# Patient Record
Sex: Female | Born: 1935 | Race: Asian | Hispanic: No | Smoking: Never smoker
Health system: Southern US, Community
[De-identification: ages and names within clinical notes are randomized; demographics above are authoritative.]

## PROBLEM LIST (undated history)

## (undated) DIAGNOSIS — J45909 Unspecified asthma, uncomplicated: Secondary | ICD-10-CM

## (undated) HISTORY — DX: Unspecified asthma, uncomplicated: J45.909

---

## 2016-10-30 ENCOUNTER — Ambulatory Visit: Payer: Self-pay | Admitting: Nurse Practitioner

## 2016-10-30 VITALS — BP 143/92 | HR 85 | Temp 98.1°F | Wt 95.0 lb

## 2016-10-30 DIAGNOSIS — S62102A Fracture of unspecified carpal bone, left wrist, initial encounter for closed fracture: Secondary | ICD-10-CM

## 2016-10-30 NOTE — Progress Notes (Unsigned)
PT HERE TODAY WITH HER DAUGHTER, TAMIL IS PRIMARY LANGUAGE  PT FELL LAST Tuesday P.M., ON MARCH 27TH, HAD L WRIST EXTENDED AND LANDED ON HER HAND. CAN MOVE HER FINGERS,  NOW RATES HER PAIN AT 7/10.   NO PAST SURGERIES, NO REGULAR MEDS OTHER THAN VITAMINS, AND IBUPROFEN FOR PAIN.    NO HISTORY OF HEART DISEASE OR DIABETES.  POSIVE FOR HISTORY OF ASTHMA.    LAST LABS DONE 2.5 YEARS AGO.      EXAM:   HERE WITH HER DAUGHTER, ALERT AND APPROPRIATE, IN NO ACUTE DISTRESS.  L WRIST MARKEDLY SWOLLEN, WITH VISIBLE INWARD DEVIATION, UNABLE TO FLEX OR EXTEND THE WRIST.    IMPRESSION/PLAN:  WILL REFER PT TO [facility], FOR X-RAY AND MANAGEMENT.    HAVE INSTRUCTED RE:  OBTAINING CHARITY CARE.    PT TO RETURN FOR OTHER HEALTHCARE NEEDS.

## 2016-10-31 ENCOUNTER — Encounter: Payer: Self-pay | Admitting: Emergency Medicine

## 2016-10-31 ENCOUNTER — Emergency Department
Admission: EM | Admit: 2016-10-31 | Discharge: 2016-10-31 | Disposition: A | Discharge status: Home / Self Care | Payer: Self-pay | Attending: Emergency Medicine | Admitting: Emergency Medicine

## 2016-10-31 ENCOUNTER — Emergency Department: Payer: Self-pay

## 2016-10-31 DIAGNOSIS — W06XXXA Fall from bed, initial encounter: Secondary | ICD-10-CM | POA: Insufficient documentation

## 2016-10-31 DIAGNOSIS — Y939 Activity, unspecified: Secondary | ICD-10-CM | POA: Insufficient documentation

## 2016-10-31 DIAGNOSIS — Y9384 Activity, sleeping: Secondary | ICD-10-CM | POA: Insufficient documentation

## 2016-10-31 DIAGNOSIS — J45909 Unspecified asthma, uncomplicated: Secondary | ICD-10-CM | POA: Insufficient documentation

## 2016-10-31 DIAGNOSIS — S52501A Unspecified fracture of the lower end of right radius, initial encounter for closed fracture: Secondary | ICD-10-CM | POA: Insufficient documentation

## 2016-10-31 DIAGNOSIS — Y999 Unspecified external cause status: Secondary | ICD-10-CM | POA: Insufficient documentation

## 2016-10-31 DIAGNOSIS — Y929 Unspecified place or not applicable: Secondary | ICD-10-CM | POA: Insufficient documentation

## 2016-10-31 MED ORDER — TRAMADOL HCL 50 MG PO TABS: 50.0000 mg | ORAL_TABLET | Freq: Two times a day (BID) | ORAL | 0 refills | Status: AC | PRN

## 2016-10-31 MED ORDER — TRAMADOL HCL 50 MG PO TABS
50.0000 mg | ORAL_TABLET | Freq: Once | ORAL | Status: AC
  Administered 2016-10-31: 50 mg via ORAL

## 2016-10-31 MED ORDER — TRAMADOL HCL 50 MG PO TABS
ORAL_TABLET | ORAL | Status: AC
  Administered 2016-10-31: 50 mg via ORAL
  Filled 2016-10-31: qty 1

## 2016-10-31 NOTE — ED Notes (Signed)
Patient transported to X-ray 

## 2016-10-31 NOTE — ED Notes (Signed)
Left wrist +1 swelling.  Yellow metal bracelet removed and left wrist velcro splint placed.  Patient tolerated well.

## 2016-10-31 NOTE — ED Triage Notes (Signed)
Fell 1 week ago. Happened while she was sleeping in bed.  Was on floor.  No other recent falls.  Swelling to left wrist.

## 2016-10-31 NOTE — ED Notes (Signed)
AAOx3.  Skin warm and dry.  NAD 

## 2016-10-31 NOTE — Discharge Instructions (Signed)
Wear splint until evaluation by orthopedics clinic. °

## 2016-10-31 NOTE — ED Provider Notes (Signed)
[facility] Emergency Department Provider Note   ____________________________________________   First MD Initiated Contact with Patient 10/31/16 1122     (approximate)  I have reviewed the triage vital signs and the nursing notes.   HISTORY Via daughter Chief Complaint Fall and Wrist Pain    HPI Isabel Hanson is a 81 y.o. female patient is pain is swelling to the left wrist secondary to fall from bed. Incident occurred one week ago.Patient stated pain increases with movement. Patient rates the pain as a 7/10. Patient described a pain as "sharp". Patient stated mild relief or ibuprofen. Patient is right-hand dominant.   Past Medical History:  Diagnosis Date  . Asthma     There are no active problems to display for this patient.   History reviewed. No pertinent surgical history.  Prior to Admission medications   Medication Sig Start Date End Date Taking? Authorizing Provider  traMADol (ULTRAM) 50 MG tablet Take 1 tablet (50 mg total) by mouth every 12 (twelve) hours as needed. 10/31/16   Joni Reining, PA-C    Allergies Patient has no known allergies.  No family history on file.  Social History Social History  Substance Use Topics  . Smoking status: Never Smoker  . Smokeless tobacco: Never Used  . Alcohol use Yes     Comment: small amounts at night     Review of Systems Constitutional: No fever/chills Eyes: No visual changes. ENT: No sore throat. Cardiovascular: Denies chest pain. Respiratory: Denies shortness of breath. Gastrointestinal: No abdominal pain.  No nausea, no vomiting.  No diarrhea.  No constipation. Genitourinary: Negative for dysuria. Musculoskeletal: Left wrist pain. Skin: Negative for rash. Neurological: Negative for headaches, focal weakness or numbness.    ____________________________________________   PHYSICAL EXAM:  VITAL SIGNS: ED Triage Vitals  Enc Vitals Group     BP 10/31/16 1106 134/84      Pulse Rate 10/31/16 1106 86     Resp 10/31/16 1106 14     Temp 10/31/16 1106 98.4 F (36.9 C)     Temp Source 10/31/16 1106 Oral     SpO2 10/31/16 1106 97 %     Weight 10/31/16 1105 95 lb (43.1 kg)     Height --      Head Circumference --      Peak Flow --      Pain Score 10/31/16 1105 7     Pain Loc --      Pain Edu? --      Excl. in GC? --     Constitutional: Alert and oriented. Well appearing and in no acute distress. Eyes: Conjunctivae are normal. PERRL. EOMI. Head: Atraumatic. Nose: No congestion/rhinnorhea. Mouth/Throat: Mucous membranes are moist.  Oropharynx non-erythematous. Neck: No stridor.  No cervical spine tenderness to palpation. Hematological/Lymphatic/Immunilogical: No cervical lymphadenopathy. Cardiovascular: Normal rate, regular rhythm. Grossly normal heart sounds.  Good peripheral circulation. Respiratory: Normal respiratory effort.  No retractions. Lungs CTAB. Gastrointestinal: Soft and nontender. No distention. No abdominal bruits. No CVA tenderness. Musculoskeletal:WRIST also shows deformity to the distal radius. Moderate edema.  Neurologic:  Normal speech and language. No gross focal neurologic deficits are appreciated. No gait instability. Skin:  Skin is warm, dry and intact. No rash noted. Psychiatric: Mood and affect are normal. Speech and behavior are normal.  ____________________________________________   LABS (all labs ordered are listed, but only abnormal results are displayed)  Labs Reviewed - No data to display ____________________________________________  EKG   ____________________________________________  RADIOLOGY  ____________________________________________   PROCEDURES  Procedure(s) performed: None  Procedures  Critical Care performed: No  ____________________________________________   INITIAL IMPRESSION / ASSESSMENT AND PLAN / ED COURSE  Pertinent labs & imaging results that were available during my care of  the patient were reviewed by me and considered in my medical decision making (see chart for details).   deformity to the left wrist. X-ray pending.      ____________________________________________   FINAL CLINICAL IMPRESSION(S) / ED DIAGNOSES  Final diagnoses:  Closed fracture of distal end of right radius, unspecified fracture morphology, initial encounter   Discussed x-ray finding with patient's daughter. Patient placed in a Velcro splint and advised to call orthopedics as afternoon to schedule appointment for definitive treatment.   NEW MEDICATIONS STARTED DURING THIS VISIT:  New Prescriptions   TRAMADOL (ULTRAM) 50 MG TABLET    Take 1 tablet (50 mg total) by mouth every 12 (twelve) hours as needed.     Note:  This document was prepared using Dragon voice recognition software and may include unintentional dictation errors.    Joni Reining, PA-C 10/31/16 1147    Emily Filbert, MD 10/31/16 [phone]

## 2017-07-15 ENCOUNTER — Encounter: Payer: Self-pay | Admitting: Nurse Practitioner

## 2020-05-23 ENCOUNTER — Ambulatory Visit
Admission: RE | Admit: 2020-05-23 | Discharge: 2020-05-23 | Disposition: A | Discharge status: Home / Self Care | Payer: Medicare Other | Attending: Family Medicine | Admitting: Family Medicine

## 2020-05-23 ENCOUNTER — Other Ambulatory Visit: Payer: Self-pay | Admitting: Family Medicine

## 2020-05-23 ENCOUNTER — Other Ambulatory Visit: Payer: Self-pay

## 2020-05-23 ENCOUNTER — Ambulatory Visit
Admission: RE | Admit: 2020-05-23 | Discharge: 2020-05-23 | Disposition: A | Discharge status: Home / Self Care | Payer: Medicare Other | Source: Ambulatory Visit | Attending: Family Medicine | Admitting: Family Medicine

## 2020-05-23 DIAGNOSIS — R0781 Pleurodynia: Secondary | ICD-10-CM | POA: Insufficient documentation

## 2020-05-23 DIAGNOSIS — M545 Low back pain, unspecified: Secondary | ICD-10-CM

## 2021-06-25 IMAGING — CR DG CHEST 2V
1 series · 2 of 2 positions shown · non-contrast
Comparison: None.

CLINICAL DATA: Lower back pain.

EXAM:
CHEST - 2 VIEW

[Series 1: dg chest 2 view · 0.14mm/px · 2 of 2 slices shown]
[im 1/2]
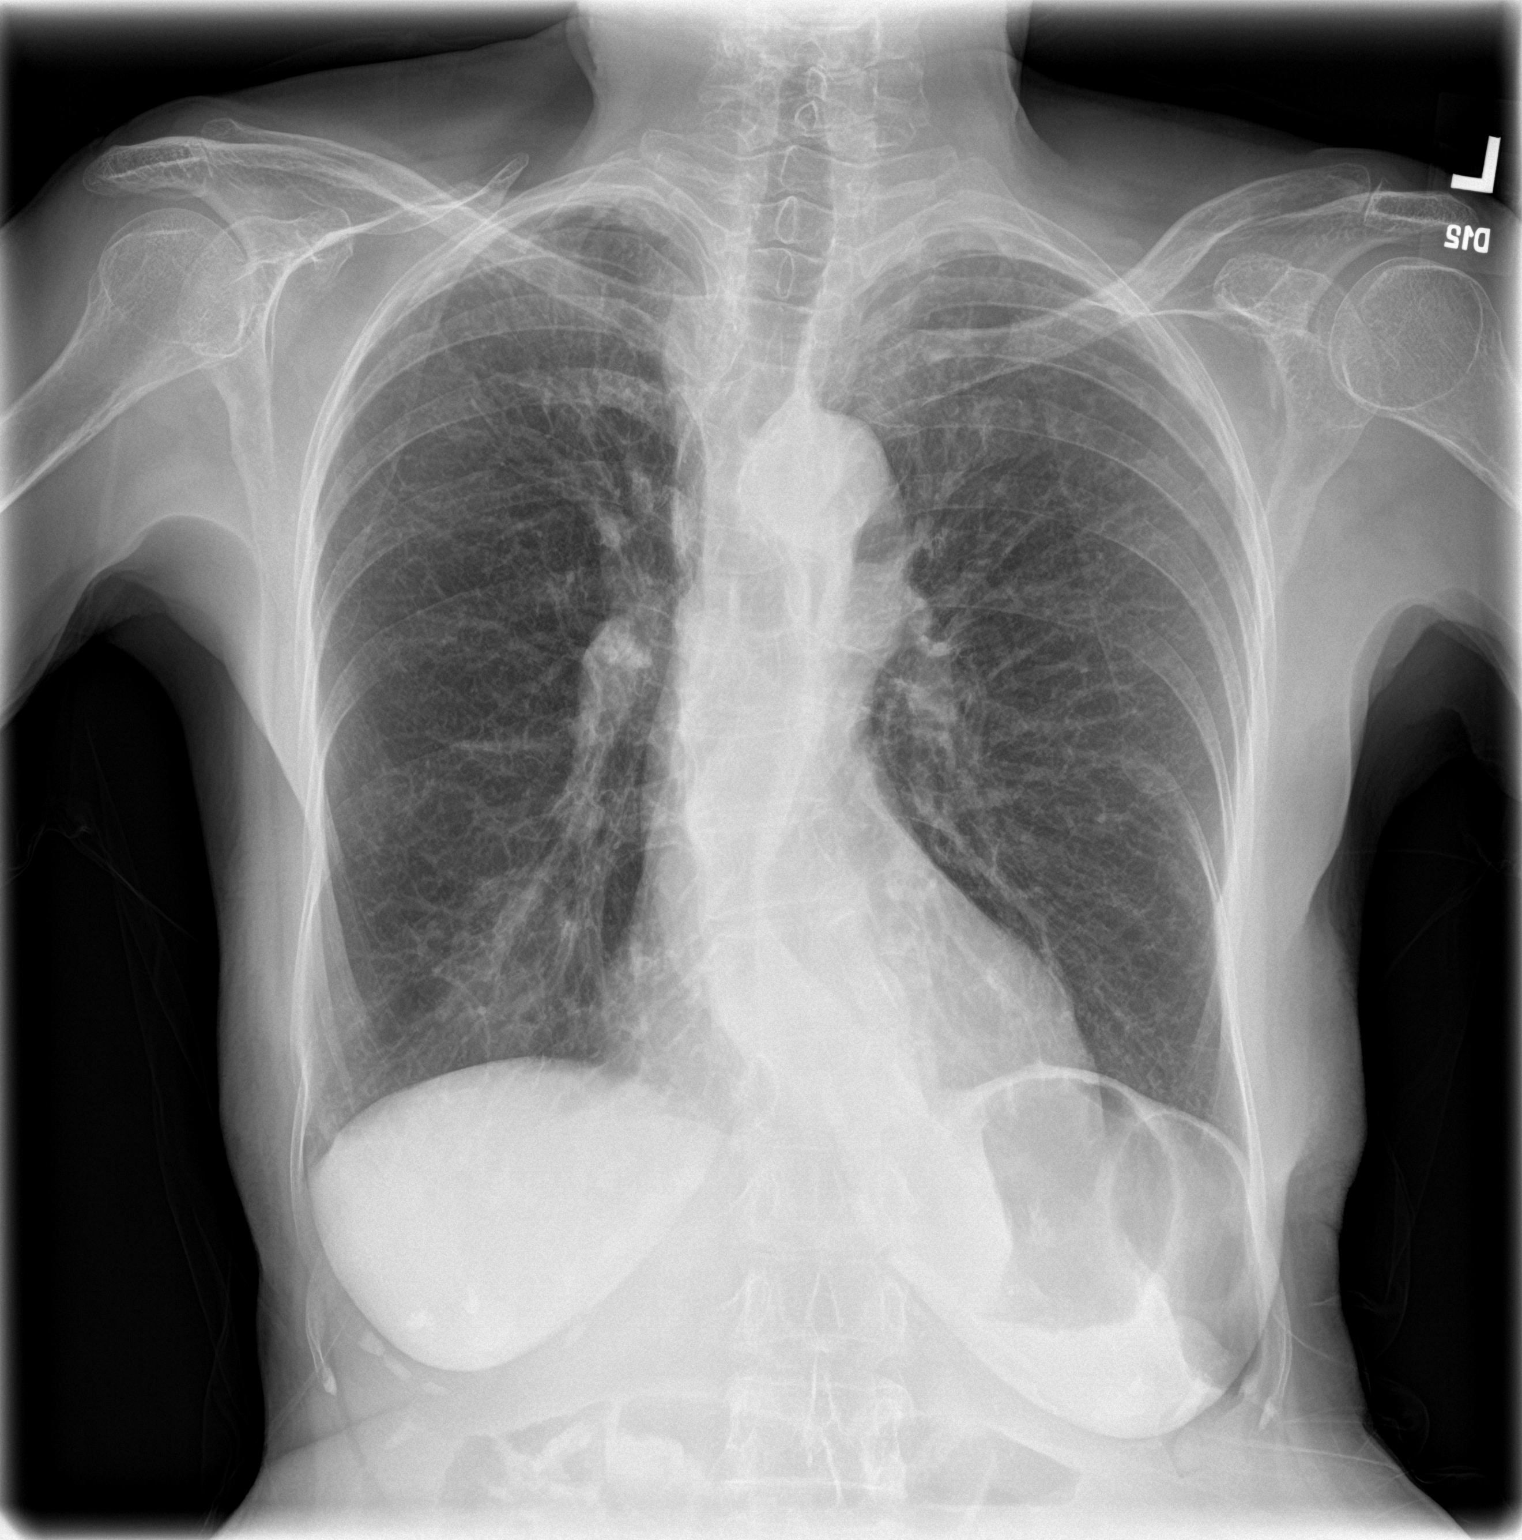
[im 2/2]
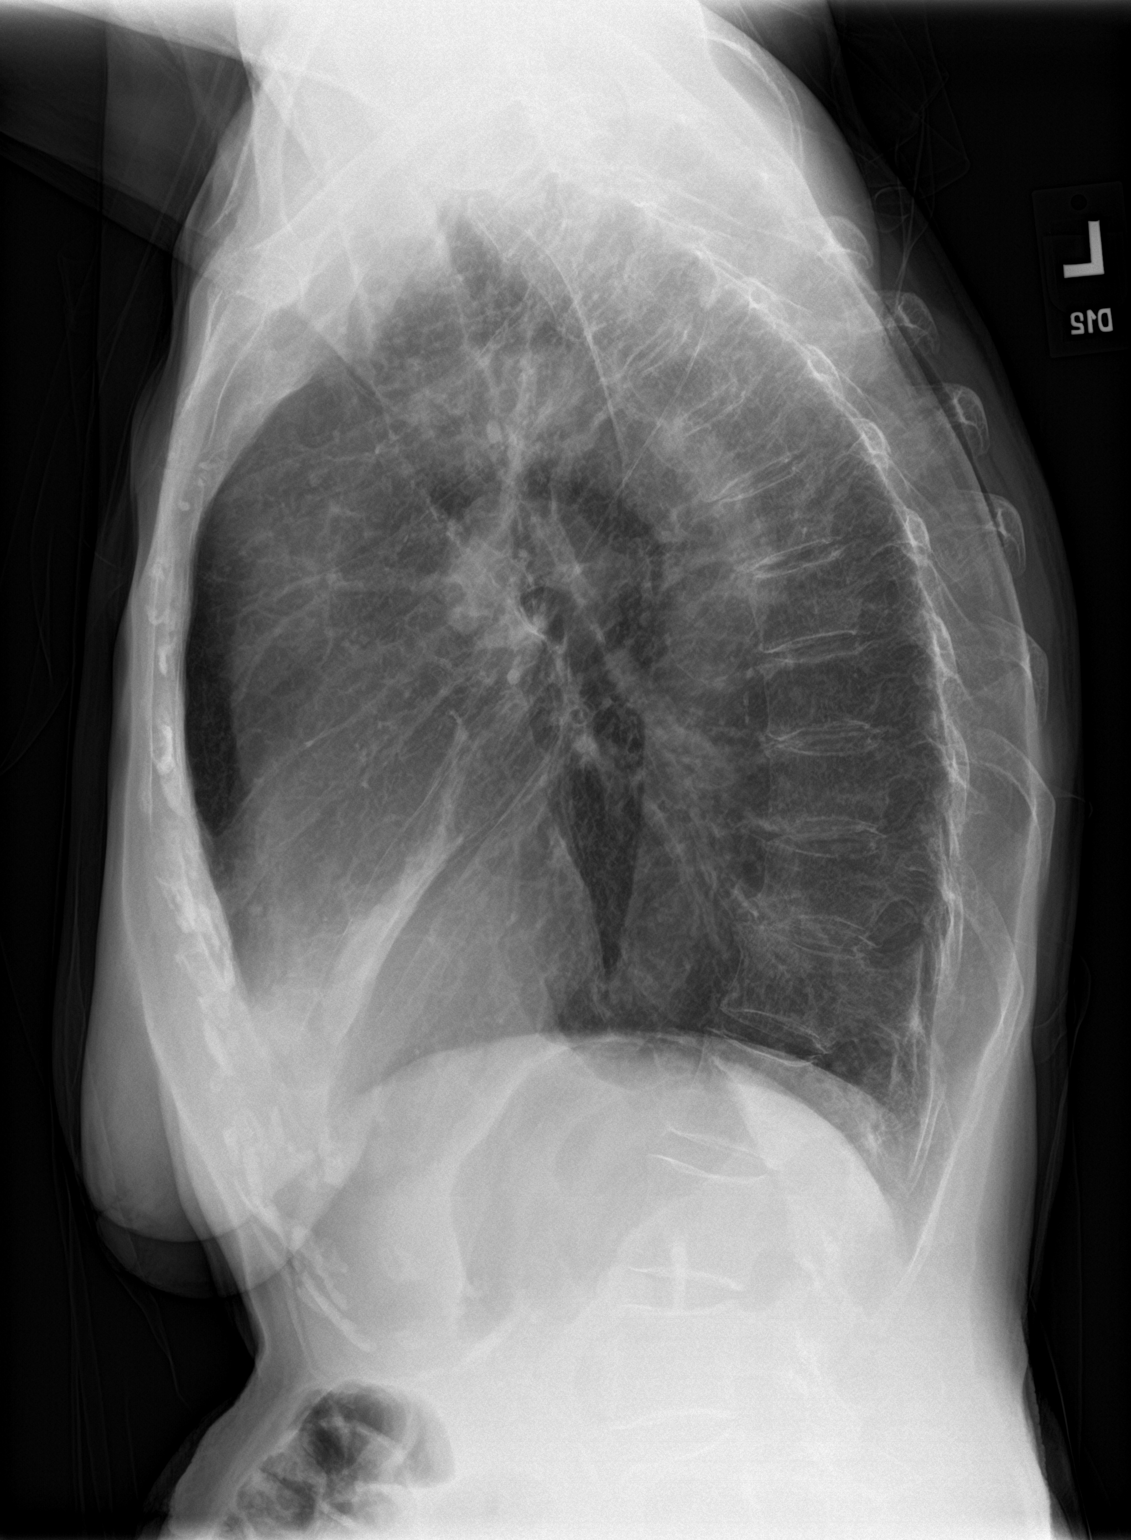

[2 of 2 positions shown; findings below may reference images not displayed]

FINDINGS: The lungs are mildly hyperinflated. Mild to moderate severity
diffuse chronic appearing increased interstitial lung markings are
noted. There is no evidence of acute infiltrate, pleural effusion or
pneumothorax. The heart size and mediastinal contours are within
normal limits. There is tortuosity of the descending thoracic aorta.
The visualized skeletal structures are unremarkable.
IMPRESSION: No active cardiopulmonary disease.
